# Patient Record
Sex: Male | Born: 1995 | Race: White | Hispanic: No | Marital: Single | Smoking: Never smoker
Health system: Southern US, Community
[De-identification: ages and names within clinical notes are randomized; demographics above are authoritative.]

---
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
## 2018-07-01 ENCOUNTER — Encounter: Payer: Self-pay | Admitting: Emergency Medicine

## 2018-07-01 ENCOUNTER — Emergency Department: Payer: BLUE CROSS/BLUE SHIELD

## 2018-07-01 ENCOUNTER — Other Ambulatory Visit: Payer: Self-pay

## 2018-07-01 DIAGNOSIS — Y929 Unspecified place or not applicable: Secondary | ICD-10-CM | POA: Insufficient documentation

## 2018-07-01 DIAGNOSIS — S0990XA Unspecified injury of head, initial encounter: Secondary | ICD-10-CM | POA: Diagnosis not present

## 2018-07-01 DIAGNOSIS — W010XXA Fall on same level from slipping, tripping and stumbling without subsequent striking against object, initial encounter: Secondary | ICD-10-CM | POA: Insufficient documentation

## 2018-07-01 DIAGNOSIS — Y9389 Activity, other specified: Secondary | ICD-10-CM | POA: Insufficient documentation

## 2018-07-01 DIAGNOSIS — S0181XA Laceration without foreign body of other part of head, initial encounter: Secondary | ICD-10-CM | POA: Diagnosis present

## 2018-07-01 DIAGNOSIS — Y999 Unspecified external cause status: Secondary | ICD-10-CM | POA: Diagnosis not present

## 2018-07-01 DIAGNOSIS — Z79899 Other long term (current) drug therapy: Secondary | ICD-10-CM | POA: Insufficient documentation

## 2018-07-01 NOTE — ED Notes (Signed)
Patient up wander into line of people attempting to check in to ED.  Patient encouraged to sit back down and await treatment room.

## 2018-07-01 NOTE — ED Notes (Signed)
Called patient's friend Fayrene Fearing, to come back to the ED to sit with patient as he has been getting up and attempting to walk out of waiting room even after repeated attempts to have patient sit down.

## 2018-07-01 NOTE — ED Notes (Signed)
Patient up ambulating around lobby, spoke with patient and encouraged him to sit down and wait to be seen for his safety.

## 2018-07-01 NOTE — ED Notes (Signed)
Unable to locate patient to have CT scans.

## 2018-07-01 NOTE — ED Triage Notes (Signed)
Patient states that he has been drinking alcohol today and tripped and fell. Patient with laceration to chin with bleeding controlled. Patient denies LOC.

## 2018-07-02 ENCOUNTER — Emergency Department
Admission: EM | Admit: 2018-07-02 | Discharge: 2018-07-02 | Disposition: A | Discharge status: Home / Self Care | Payer: BLUE CROSS/BLUE SHIELD | Attending: Emergency Medicine | Admitting: Emergency Medicine

## 2018-07-02 DIAGNOSIS — S0990XA Unspecified injury of head, initial encounter: Secondary | ICD-10-CM

## 2018-07-02 DIAGNOSIS — S0181XA Laceration without foreign body of other part of head, initial encounter: Secondary | ICD-10-CM

## 2018-07-02 MED ORDER — LIDOCAINE HCL (PF) 1 % IJ SOLN
5.0000 mL | Freq: Once | INTRAMUSCULAR | Status: AC
  Administered 2018-07-02: 5 mL

## 2018-07-02 MED ORDER — LIDOCAINE HCL (PF) 1 % IJ SOLN
INTRAMUSCULAR | Status: AC
  Filled 2018-07-02: qty 5

## 2018-07-02 NOTE — Discharge Instructions (Addendum)
Please return to the emergency department for any signs of infection such as increased redness, pain, pus or development of fever.  Otherwise please follow-up with your doctor for recheck/reevaluation.  The sutures are absorbable and will not need to be removed.

## 2018-07-02 NOTE — ED Notes (Signed)
Pt unsuccessful at this time reaching an Benedetto Goad; will continue to try; pt will use call bell when someone is on the way;

## 2018-07-02 NOTE — ED Notes (Signed)
Pt states has been drinking today. Pt states last ETOH at 1800 when he fell. Pt denies known loc. Pt is ambulatory without difficulty and is alert and oriented x4. No slurred speech noted. Pt states he will be able to take and [employer] taxi home.

## 2018-07-02 NOTE — ED Notes (Signed)
Dr. Lenard Lance states pt is ok to take an uber home.

## 2018-07-02 NOTE — ED Provider Notes (Addendum)
[facility] Emergency Department Provider Note  Time seen: 1:56 AM  I have reviewed the triage vital signs and the nursing notes.   HISTORY  Chief Complaint Fall and Laceration   HPI Jorge Gonzales is a 23 y.o. male with no past medical history presents to the emergency department after a fall with a chin laceration.  According to the patient he was drinking alcohol, states he is not sure what happened but he tripped and fell landing on his chin.  Patient states he hit his head.  Does not know if he passed out.  Patient is only complaint is a laceration to his chin.  Denies any recent fever cough congestion or shortness of breath.   History reviewed. No pertinent past medical history.  There are no active problems to display for this patient.   History reviewed. No pertinent surgical history.  Prior to Admission medications   Medication Sig Start Date End Date Taking? Authorizing Provider  ibuprofen (ADVIL,MOTRIN) 800 MG tablet Take 1 tablet (800 mg total) by mouth every 8 (eight) hours as needed. 11/08/15   Evon Slack, PA-C    No Known Allergies  No family history on file.  Social History Social History   Tobacco Use  . Smoking status: Never Smoker  . Smokeless tobacco: Never Used  Substance Use Topics  . Alcohol use: Yes  . Drug use: Yes    Types: Marijuana    Review of Systems Constitutional: Negative for fever.   Cardiovascular: Negative for chest pain. Respiratory: Negative for shortness of breath. Gastrointestinal: Negative for abdominal pain Musculoskeletal: Negative for musculoskeletal complaints Skin: Laceration to his chin Neurological: Negative for headache All other ROS negative  ____________________________________________   PHYSICAL EXAM:  VITAL SIGNS: ED Triage Vitals [07/01/18 2005]  Enc Vitals Group     BP 128/64     Pulse Rate (!) 111     Resp 18     Temp 98 F (36.7 C)     Temp Source Oral     SpO2 95 %   Weight 150 lb (68 kg)     Height 5\' 11"  (1.803 m)     Head Circumference      Peak Flow      Pain Score 0     Pain Loc      Pain Edu?      Excl. in GC?    Constitutional: Alert and oriented. Well appearing and in no distress. Eyes: Normal exam ENT      Head: Patient has an approximate 1 cm laceration just below his chin.  Hemostatic.      Mouth/Throat: Mucous membranes are moist. Cardiovascular: Normal rate, regular rhythm. Respiratory: Normal respiratory effort without tachypnea nor retractions. Breath sounds are clear  Gastrointestinal: Soft and nontender. No distention.   Musculoskeletal: Small abrasion to right knee. Neurologic:  Normal speech and language. No gross focal neurologic deficits  Skin:  Skin is warm.  Small abrasion right knee.  Small laceration under his chin Psychiatric: Mood and affect are normal     RADIOLOGY  CT scans negative for acute abnormality  ____________________________________________   INITIAL IMPRESSION / ASSESSMENT AND PLAN / ED COURSE  Pertinent labs & imaging results that were available during my care of the patient were reviewed by me and considered in my medical decision making (see chart for details).   Patient presents to the emergency department after a fall.  Patient is to drinking alcohol, believes he fell on pavement.  Patient does have a small laceration under his chin, it is clean in appearance but there is a degree of abrasion to the area as well.  Patient CT scans are negative for acute abnormality.  Laceration was cleaned extensively with saline.  Repaired with 3 sutures.  Discussed return precautions.  Patient is awake alert oriented, no distress, overall well-appearing.  Wishes to take an [employer] home which I believe is reasonable.  Chapman MossReid Michelle was evaluated in Emergency Department on 07/02/2018 for the symptoms described in the history of present illness. He was evaluated in the context of the global COVID-19 pandemic, which  necessitated consideration that the patient might be at risk for infection with the SARS-CoV-2 virus that causes COVID-19. Institutional protocols and algorithms that pertain to the evaluation of patients at risk for COVID-19 are in a state of rapid change based on information released by regulatory bodies including the CDC and federal and state organizations. These policies and algorithms were followed during the patient's care in the ED.  LACERATION REPAIR Performed by: Minna AntisKevin Taviana Westergren Authorized by: Minna AntisKevin Jaiya Mooradian Consent: Verbal consent obtained. Risks and benefits: risks, benefits and alternatives were discussed Consent given by: patient Patient identity confirmed: provided demographic data Prepped and Draped in normal sterile fashion Wound explored  Laceration Location: Chin  Laceration Length: 1.5cm  No Foreign Bodies seen or palpated  Anesthesia: local infiltration  Local anesthetic: lidocaine 1 % without epinephrine  Anesthetic total: 3 ml  Irrigation method: syringe Amount of cleaning: standard  Skin closure: 4-0 rapid Vicryl  Number of sutures: 3  Technique: Simple interrupted  Patient tolerance: Patient tolerated the procedure well with no immediate complications.   ____________________________________________   FINAL CLINICAL IMPRESSION(S) / ED DIAGNOSES  Laceration Thresa RossFall   Andie Mortimer, MD 07/02/18 Denny [id number]    Minna AntisPaduchowski, Joye Wesenberg, MD 07/02/18 0200

## 2018-07-02 NOTE — ED Notes (Signed)
Patient given update on wait time. Patient verbalizes understanding.  

## 2020-11-06 IMAGING — CT CT HEAD WITHOUT CONTRAST
5 of 7 series · 17 of 47 positions shown, 18 images · non-contrast
Comparison: Head CT dated 11/08/2015

CLINICAL DATA: 22-year-old male with head trauma.

EXAM:
CT HEAD WITHOUT CONTRAST
CT CERVICAL SPINE WITHOUT CONTRAST
TECHNIQUE: Multidetector CT imaging of the head and cervical spine was
performed following the standard protocol without intravenous
contrast. Multiplanar CT image reconstructions of the cervical spine
were also generated.

[Series 3: head wo · axial · 0.40mm/px · z∈[+347,+397]mm · 2 of 30 slices shown, 3 images]
[im 10/30  brain]
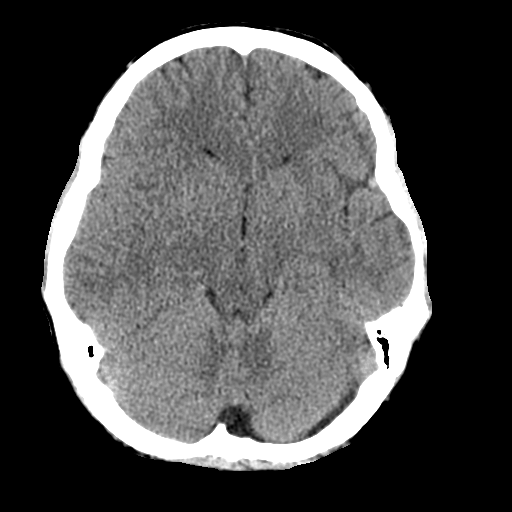
[im 10/30  bone]
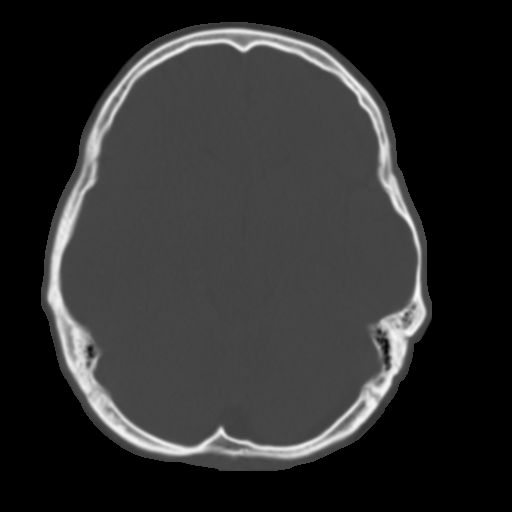
[im 20/30  brain]
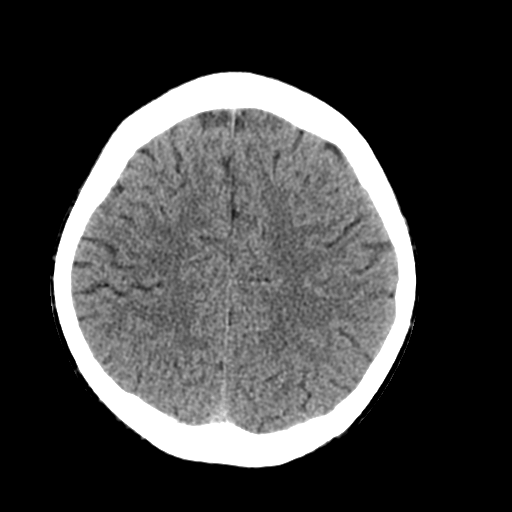

[Series 5: c spine soft · axial · 0.38mm/px · z∈[+177,+209]mm · 3 of 81 slices shown]
[im 9/81  brain]
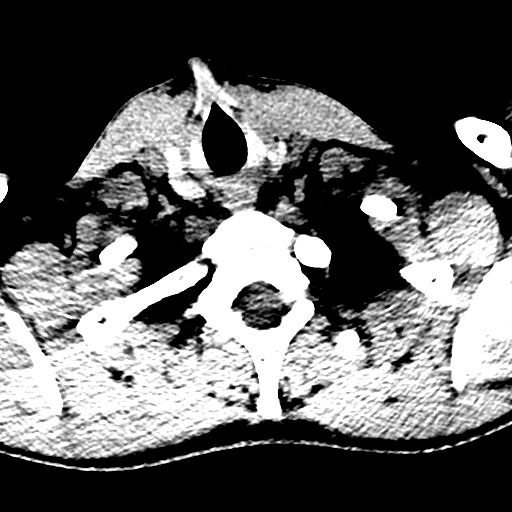
[im 17/81  brain]
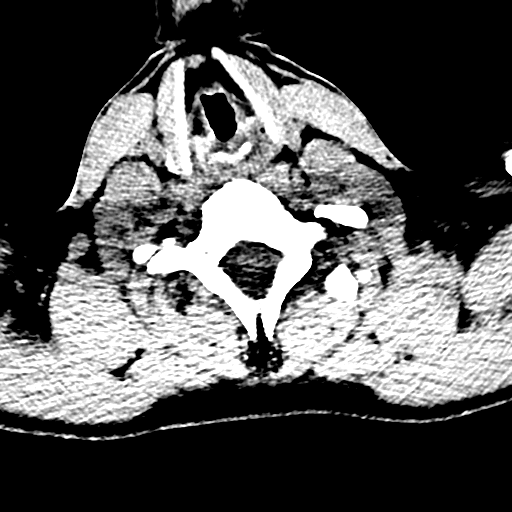
[im 25/81  brain]
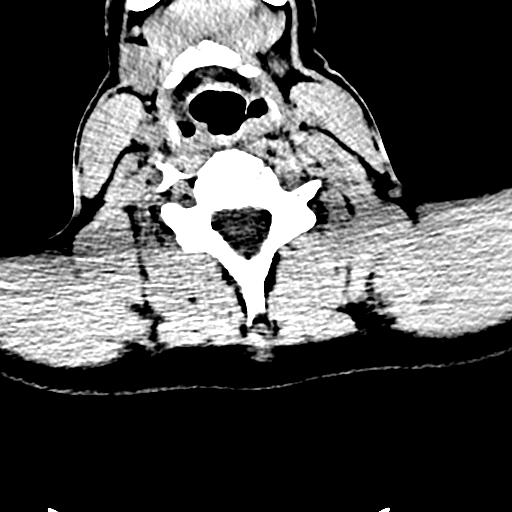

[Series 6: coronal soft tissue · coronal · 0.29mm/px · 3 of 62 slices shown]
[im 4/62  brain]
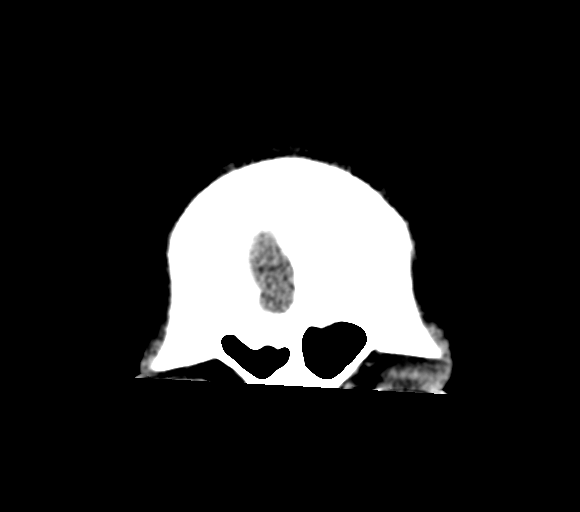
[im 8/62  brain]
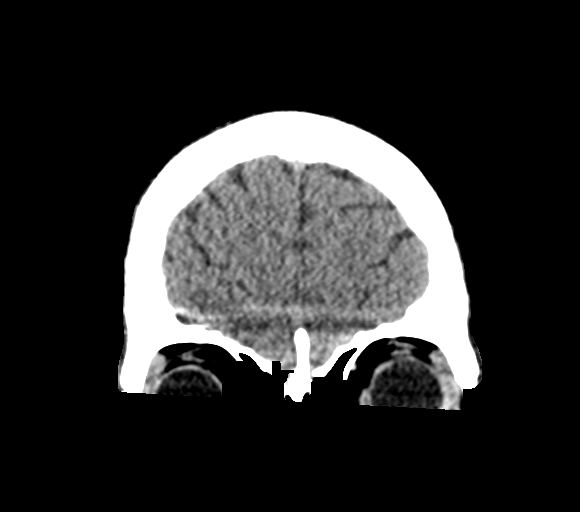
[im 12/62  brain]
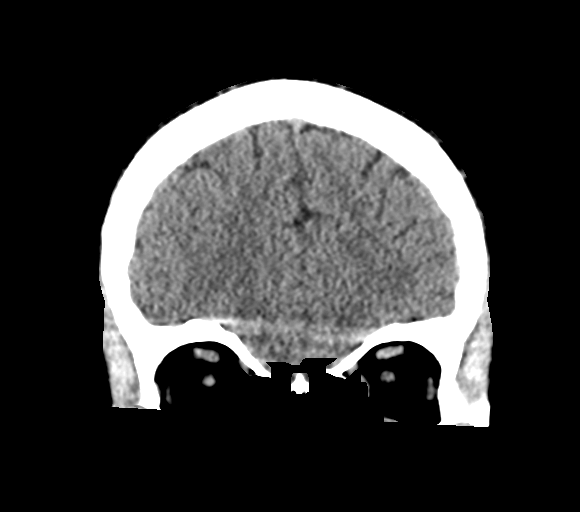

[Series 7: sagittal soft tissue · sagittal · 0.31mm/px · 1 of 52 slices shown]
[im 26/52  brain]
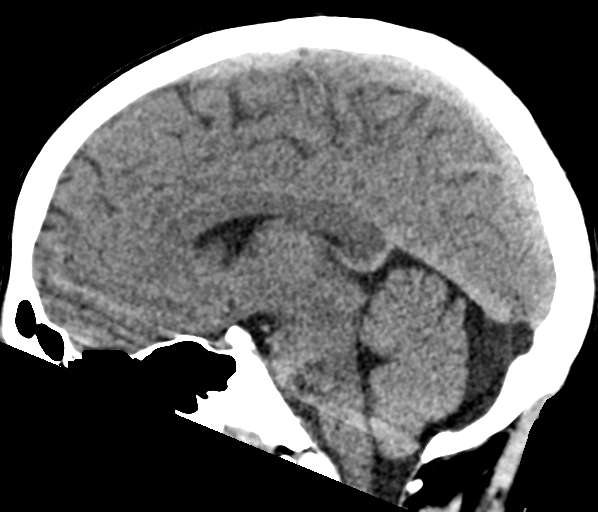

[Series 10: orthogonal bone · axial · 0.20mm/px · z∈[+119,+276]mm · 8 of 111 slices shown]
[im 8/111  bone]
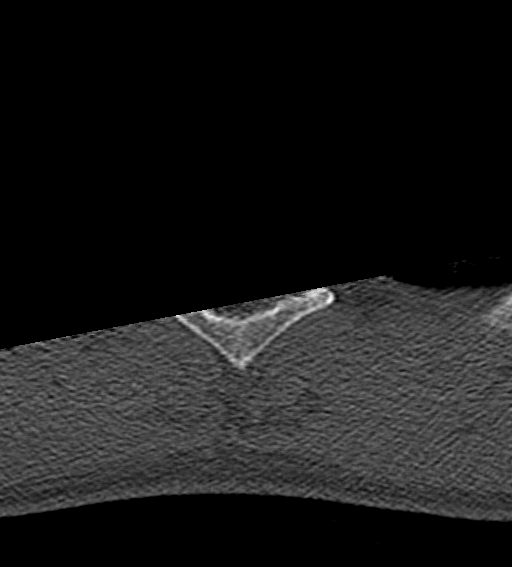
[im 24/111  bone]
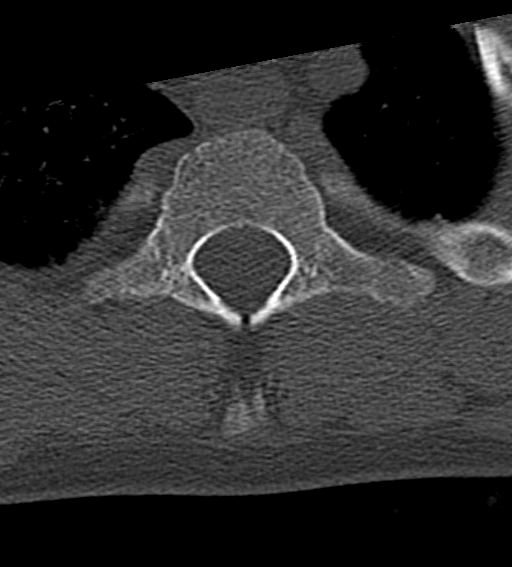
[im 40/111  bone]
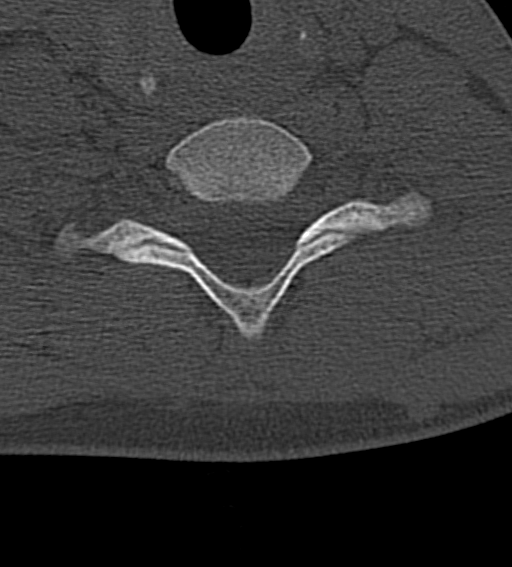
[im 48/111  bone]
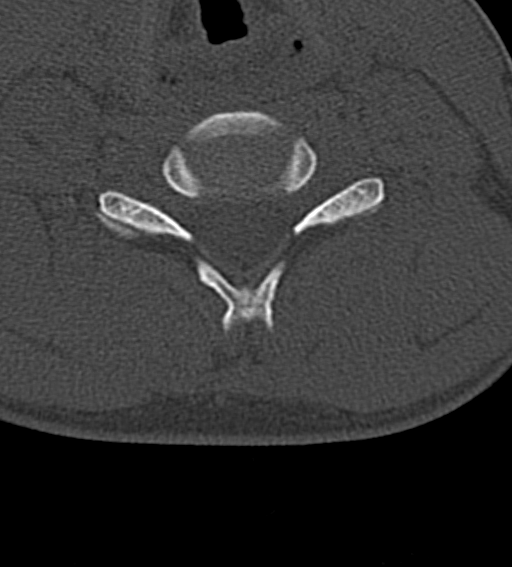
[im 63/111  bone]
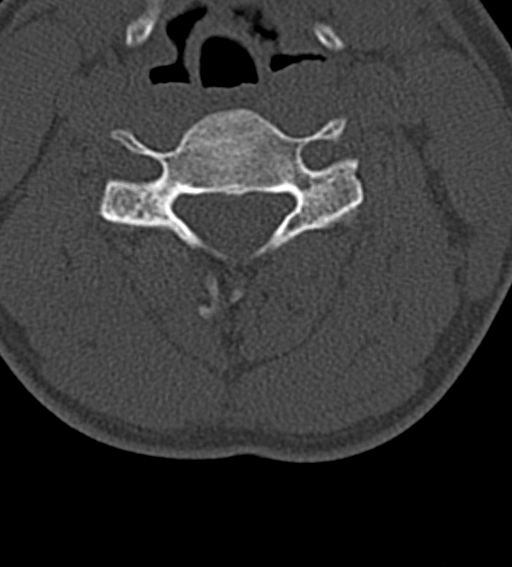
[im 71/111  bone]
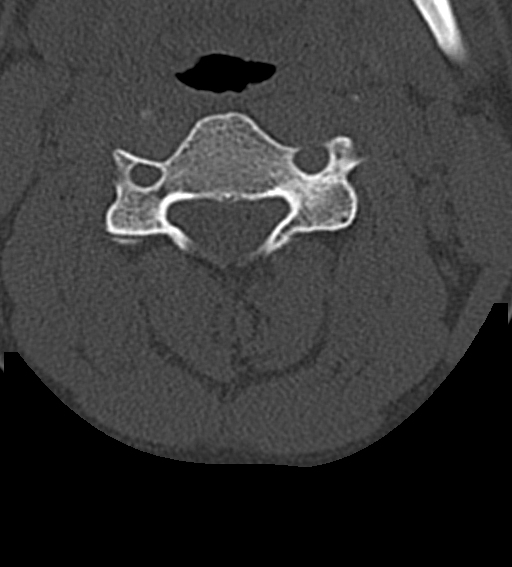
[im 87/111  bone]
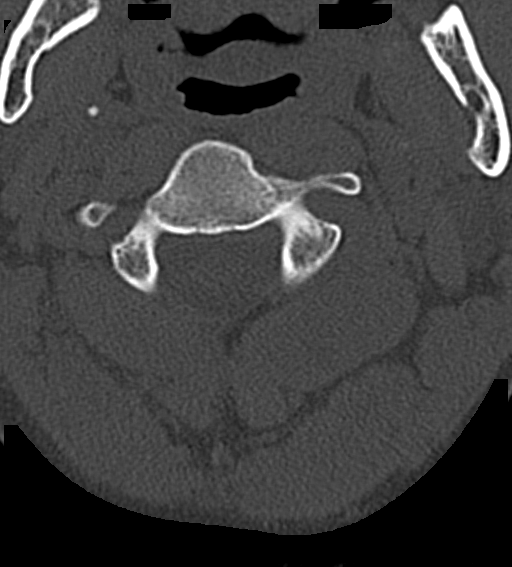
[im 103/111  bone]
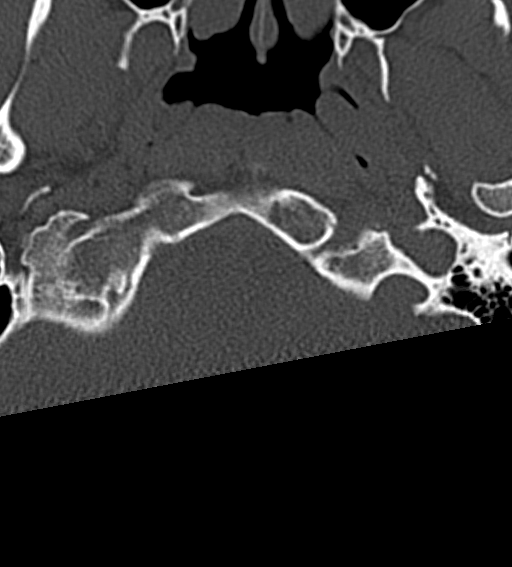

[17 of 47 positions shown; findings below may reference images not displayed]

FINDINGS: CT HEAD FINDINGS

Brain: No evidence of acute infarction, hemorrhage, hydrocephalus,
extra-axial collection or mass lesion/mass effect.

Vascular: No hyperdense vessel or unexpected calcification.

Skull: Normal. Negative for fracture or focal lesion.

Sinuses/Orbits: No acute finding.

Other: None.

CT CERVICAL SPINE FINDINGS

Alignment: No acute subluxation.

Skull base and vertebrae: No acute fracture. There is incomplete
bony fusion of the posterior ring of C1 with developmental fusion of
the lateral masses of C1 to the occipital condyle.

Soft tissues and spinal canal: No prevertebral fluid or swelling. No
visible canal hematoma.

Disc levels: No acute findings. No significant degenerative changes.

Upper chest: Negative.

Other: None
IMPRESSION: 1. Normal unenhanced CT of the brain.
2. No acute/traumatic cervical spine pathology.
